# Patient Record
Sex: Female | Born: 1975
Health system: Southern US, Community
[De-identification: ages and names within clinical notes are randomized; demographics above are authoritative.]

## PROBLEM LIST (undated history)

## (undated) HISTORY — PX: TONSILLECTOMY: SUR1361

---

## 2016-02-08 ENCOUNTER — Emergency Department (HOSPITAL_BASED_OUTPATIENT_CLINIC_OR_DEPARTMENT_OTHER)
Admission: EM | Admit: 2016-02-08 | Discharge: 2016-02-08 | Disposition: A | Discharge status: Home / Self Care | Payer: 59 | Attending: Emergency Medicine | Admitting: Emergency Medicine

## 2016-02-08 ENCOUNTER — Emergency Department (HOSPITAL_BASED_OUTPATIENT_CLINIC_OR_DEPARTMENT_OTHER): Payer: 59

## 2016-02-08 ENCOUNTER — Encounter (HOSPITAL_BASED_OUTPATIENT_CLINIC_OR_DEPARTMENT_OTHER): Payer: Self-pay | Admitting: *Deleted

## 2016-02-08 DIAGNOSIS — Y929 Unspecified place or not applicable: Secondary | ICD-10-CM | POA: Diagnosis not present

## 2016-02-08 DIAGNOSIS — S99922A Unspecified injury of left foot, initial encounter: Secondary | ICD-10-CM

## 2016-02-08 DIAGNOSIS — Y999 Unspecified external cause status: Secondary | ICD-10-CM | POA: Diagnosis not present

## 2016-02-08 DIAGNOSIS — Y9389 Activity, other specified: Secondary | ICD-10-CM | POA: Diagnosis not present

## 2016-02-08 DIAGNOSIS — W230XXA Caught, crushed, jammed, or pinched between moving objects, initial encounter: Secondary | ICD-10-CM | POA: Diagnosis not present

## 2016-02-08 MED ORDER — NAPROXEN 500 MG PO TABS: 500.0000 mg | ORAL_TABLET | Freq: Two times a day (BID) | ORAL | Status: DC

## 2016-02-08 MED ORDER — NAPROXEN 250 MG PO TABS
500.0000 mg | ORAL_TABLET | Freq: Once | ORAL | Status: AC
  Administered 2016-02-08: 500 mg via ORAL
  Filled 2016-02-08: qty 2

## 2016-02-08 MED ORDER — OXYCODONE-ACETAMINOPHEN 5-325 MG PO TABS
1.0000 | ORAL_TABLET | Freq: Once | ORAL | Status: AC
  Administered 2016-02-08: 1 via ORAL
  Filled 2016-02-08: qty 1

## 2016-02-08 MED ORDER — OXYCODONE-ACETAMINOPHEN 5-325 MG PO TABS: 1.0000 | ORAL_TABLET | Freq: Four times a day (QID) | ORAL | Status: DC | PRN

## 2016-02-08 MED FILL — OXYCODONE/APAP 5-325: 5-325 | 3 days supply | Qty: 10 | Fill #0

## 2016-02-08 MED FILL — NAPROXEN 500 MG TABLET: 500 | 15 days supply | Qty: 30 | Fill #0

## 2016-02-08 NOTE — ED Provider Notes (Signed)
CSN: [id number]     Arrival date & time 02/08/16  1457 History   First MD Initiated Contact with Patient 02/08/16 1511     Chief Complaint  Patient presents with  . Foot Injury     (Consider location/radiation/quality/duration/timing/severity/associated sxs/prior Treatment) HPI   Brooke Oliver is a 40 y.o. female, patient with no pertinent past medical history, presenting to the ED with a left foot injury that occurred about an hour prior to arrival. Patient states she was pulling the car into the garage with her left foot dangling out of the car door. Patient did not move her foot back into the car in time and caught her foot in between the frame of the car and the garage doorway. Patient was able to back the car out immediately. Complains of pain and swelling to the top of the left foot. Has not taken any medication for her pain. Denies neuro deficits, pain extending up the leg, or any other pain, injuries, or complaints.     History reviewed. No pertinent past medical history. Past Surgical History  Procedure Laterality Date  . Tonsillectomy     No family history on file. Social History  Substance Use Topics  . Smoking status: Never Smoker   . Smokeless tobacco: None  . Alcohol Use: Yes     Comment: occ   OB History    No data available     Review of Systems  Musculoskeletal: Positive for arthralgias (left foot pain). Negative for back pain and neck pain.       Left foot swelling and bruising  Skin: Positive for color change and wound.  Neurological: Negative for weakness and numbness.      Allergies  Review of patient's allergies indicates no known allergies.  Home Medications   Prior to Admission medications   Medication Sig Start Date End Date Taking? Authorizing Provider  naproxen (NAPROSYN) 500 MG tablet Take 1 tablet (500 mg total) by mouth 2 (two) times daily. 02/08/16   Kala Ambriz C Shatima Zalar, PA-C  oxyCODONE-acetaminophen (PERCOCET/ROXICET) 5-325 MG tablet Take 1  tablet by mouth every 6 (six) hours as needed for severe pain. 02/08/16   Lyndsey Demos C Quinne Pires, PA-C   BP 143/85 mmHg  Pulse 96  Temp(Src) 98.5 F (36.9 C)  Resp 20  Ht  (1.499 m)  Wt 86.183 kg  BMI 38.35 kg/m2  SpO2 97% Physical Exam  Constitutional: She appears well-developed and well-nourished. No distress.  HENT:  Head: Normocephalic and atraumatic.  Eyes: Conjunctivae are normal.  Neck: Neck supple.  Cardiovascular: Normal rate and regular rhythm.   Pulmonary/Chest: Effort normal.  Musculoskeletal: She exhibits edema and tenderness.  Tenderness, bruising, and swelling to the dorsum of the left foot. Full range of motion in the left ankle and toes. No notable deformity, crepitus, or deep open wounds.  Neurological: She is alert.  No sensory deficits in bilateral feet and toes. Strength is 5 out of 5 bilaterally.  Skin: Skin is warm and dry. She is not diaphoretic.  Superficial abrasions across the dorsum of the left foot.  Psychiatric: She has a normal mood and affect. Her behavior is normal.  Nursing note and vitals reviewed.   ED Course  Procedures (including critical care time)  Imaging Review Dg Foot Complete Left  02/08/2016  CLINICAL DATA:  Crush injury left foot today, pain and swelling EXAM: LEFT FOOT - COMPLETE 3+ VIEW COMPARISON:  None. FINDINGS: Three views of the left foot submitted. No acute fracture or  subluxation. No radiopaque foreign body. There is soft tissue swelling metatarsal region. Tiny plantar spur of calcaneus. IMPRESSION: No acute fracture or subluxation. Soft tissue swelling metatarsal region. Electronically Signed   By: Natasha MeadLiviu  Pop M.D.   On: 02/08/2016 15:43   I have personally reviewed and evaluated these images as part of my medical decision-making.   EKG Interpretation None      MDM   Final diagnoses:  Foot injury, left, initial encounter    Brooke Oliver presents with left foot injury that occurred just prior to arrival.  No osseous  abnormality on x-ray. Postop shoe and crutches provided to avoid weightbearing. Patient to follow-up with orthopedic should symptoms fail to resolve. The patient was given instructions for home care as well as return precautions. Patient voices understanding of these instructions, accepts the plan, and is comfortable with discharge.    Anselm PancoastShawn C Tabatha Razzano, PA-C 02/08/16 1630  Lyndal Pulleyaniel Knott, MD 02/10/16 [phone]

## 2016-02-08 NOTE — Discharge Instructions (Signed)
You have been seen today for a foot injury. Your imaging showed no abnormalities. There could still be soft tissue injuries or injuries to structures like ligaments. Use the post op shoe and crutches to keep weight off of the foot. Use naproxen or ibuprofen to reduce pain and inflammation. Elevate the extremity as often as possible. Apply ice to reduce inflammation. Follow-up with orthopedics should symptoms fail to resolve.

## 2016-02-08 NOTE — ED Notes (Addendum)
Pt's shoe sized called for a medium post op shoe however it was too big and bulky causing problems walking. Pt opted to go with a small post op shoe instead.

## 2016-02-08 NOTE — ED Notes (Signed)
She crushed her left foot between her car and the wall of the garage. Swelling and pain. Ice pack on arrival.

## 2017-03-13 IMAGING — CR DG FOOT COMPLETE 3+V*L*
3 series · 3 of 3 positions shown · non-contrast
Comparison: None.

CLINICAL DATA: Crush injury left foot today, pain and swelling

EXAM:
LEFT FOOT - COMPLETE 3+ VIEW

[t foot ap left]
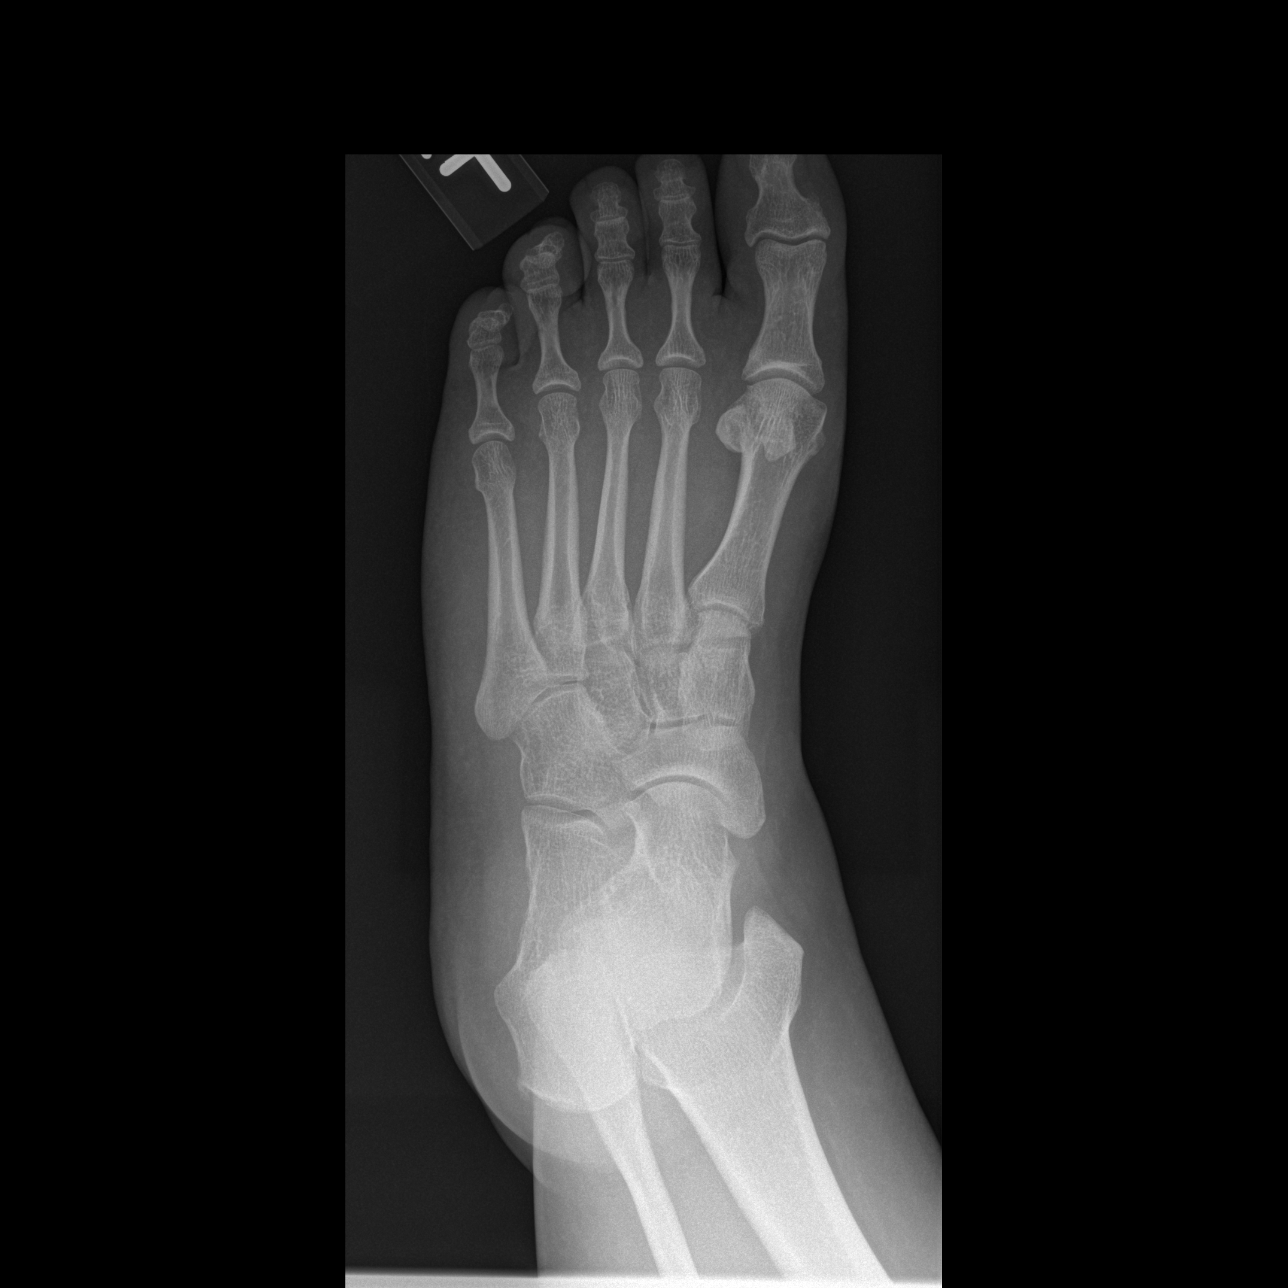

[t foot oblique left]
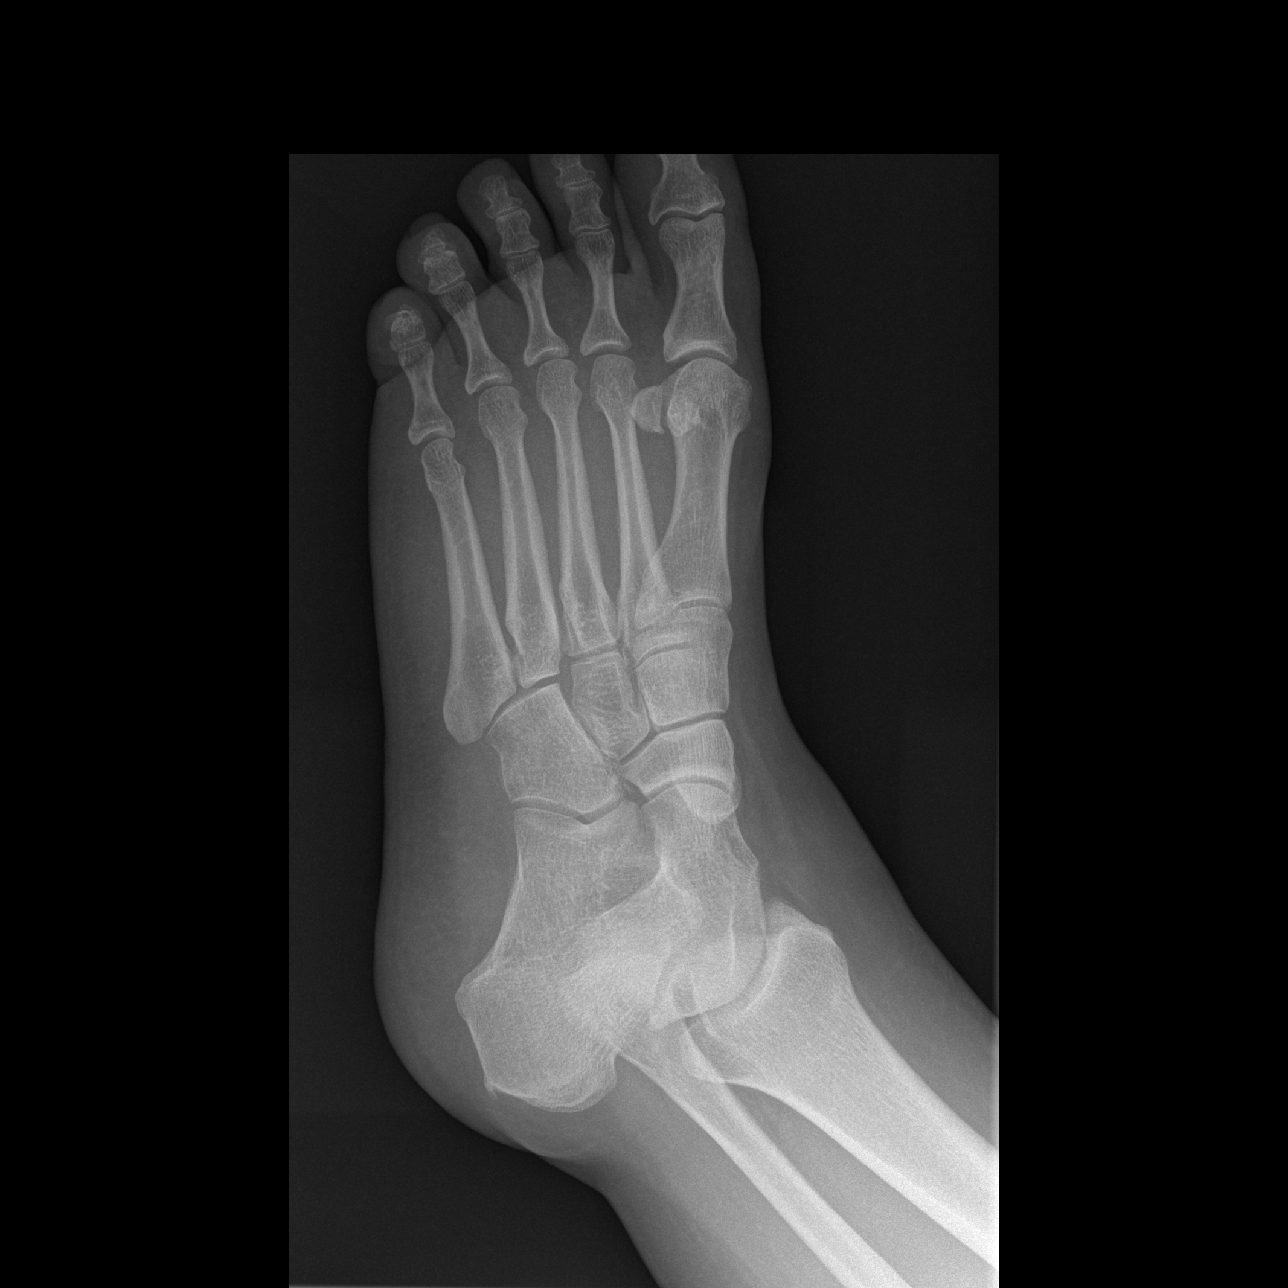

[t foot lat left]
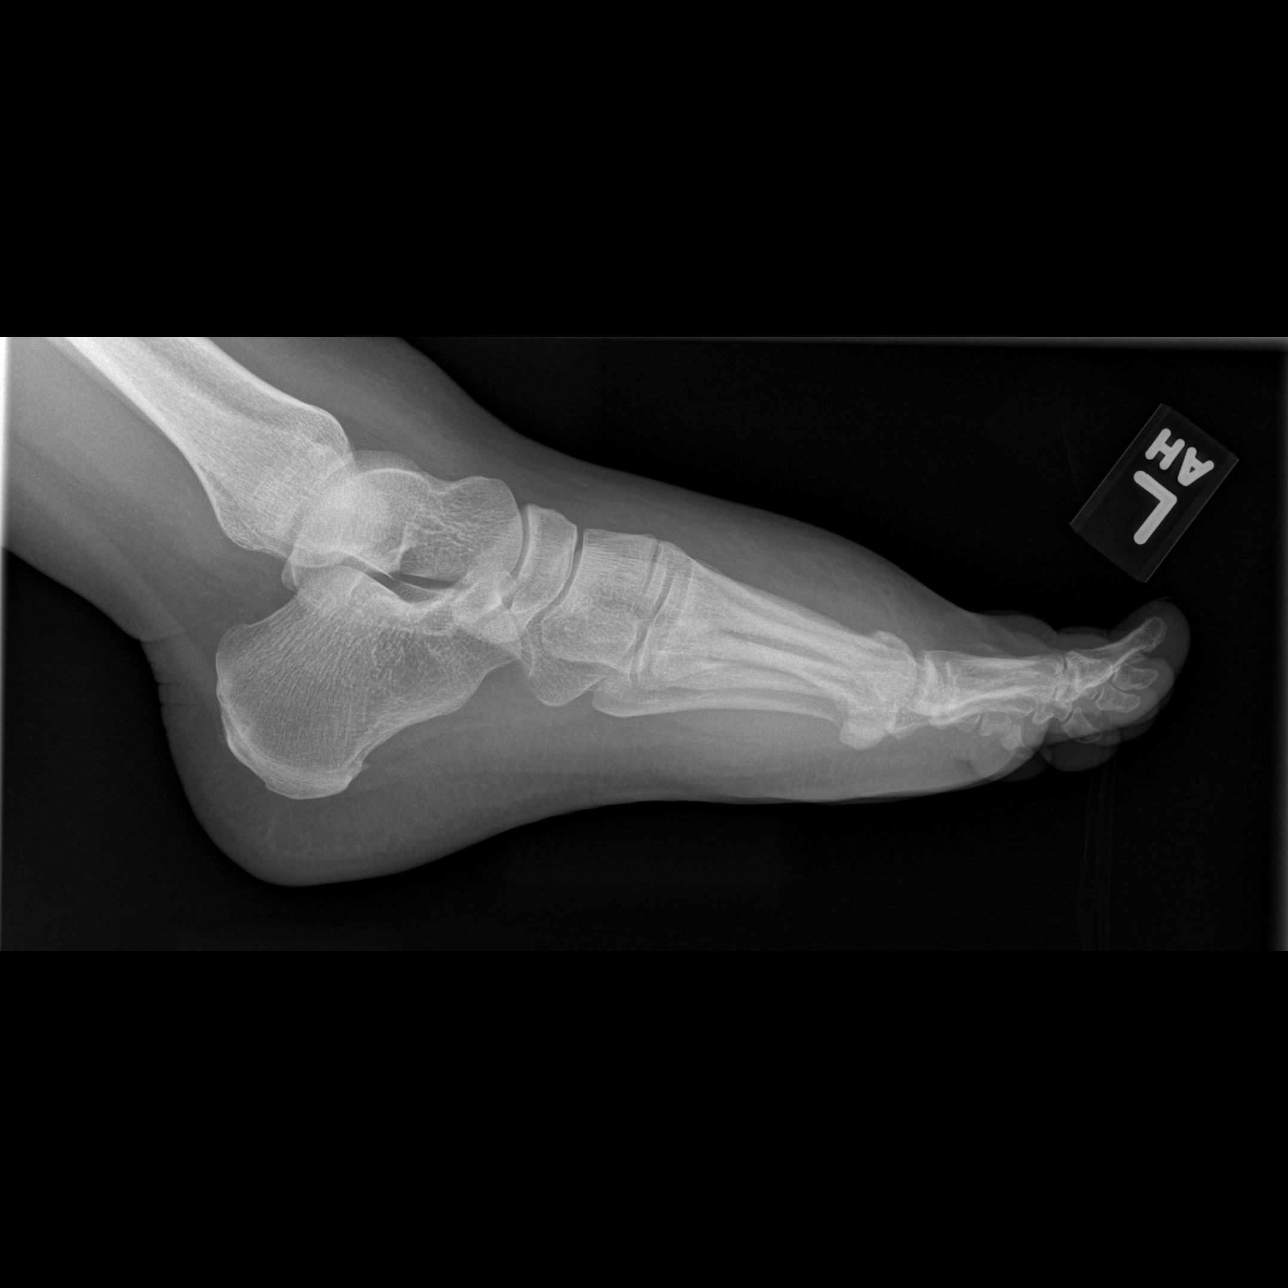

[3 of 3 positions shown; findings below may reference images not displayed]

FINDINGS: Three views of the left foot submitted. No acute fracture or
subluxation. No radiopaque foreign body. There is soft tissue
swelling metatarsal region. Tiny plantar spur of calcaneus.
IMPRESSION: No acute fracture or subluxation. Soft tissue swelling metatarsal
region.

## 2020-08-21 ENCOUNTER — Emergency Department (HOSPITAL_BASED_OUTPATIENT_CLINIC_OR_DEPARTMENT_OTHER)
Admission: EM | Admit: 2020-08-21 | Discharge: 2020-08-22 | Disposition: A | Discharge status: Home / Self Care | Payer: 59 | Attending: Emergency Medicine | Admitting: Emergency Medicine

## 2020-08-21 ENCOUNTER — Other Ambulatory Visit: Payer: Self-pay

## 2020-08-21 ENCOUNTER — Encounter (HOSPITAL_BASED_OUTPATIENT_CLINIC_OR_DEPARTMENT_OTHER): Payer: Self-pay | Admitting: Emergency Medicine

## 2020-08-21 DIAGNOSIS — S81811A Laceration without foreign body, right lower leg, initial encounter: Secondary | ICD-10-CM | POA: Diagnosis not present

## 2020-08-21 DIAGNOSIS — Z7289 Other problems related to lifestyle: Secondary | ICD-10-CM

## 2020-08-21 DIAGNOSIS — Z20822 Contact with and (suspected) exposure to covid-19: Secondary | ICD-10-CM | POA: Diagnosis not present

## 2020-08-21 DIAGNOSIS — X781XXA Intentional self-harm by knife, initial encounter: Secondary | ICD-10-CM | POA: Insufficient documentation

## 2020-08-21 DIAGNOSIS — F322 Major depressive disorder, single episode, severe without psychotic features: Secondary | ICD-10-CM | POA: Insufficient documentation

## 2020-08-21 DIAGNOSIS — S8991XA Unspecified injury of right lower leg, initial encounter: Secondary | ICD-10-CM | POA: Diagnosis present

## 2020-08-21 DIAGNOSIS — R000 Tachycardia, unspecified: Secondary | ICD-10-CM | POA: Diagnosis not present

## 2020-08-21 MED ORDER — LIDOCAINE HCL (PF) 1 % IJ SOLN
INTRAMUSCULAR | Status: AC
  Filled 2020-08-21: qty 30

## 2020-08-21 MED ORDER — LIDOCAINE HCL (PF) 1 % IJ SOLN
30.0000 mL | Freq: Once | INTRAMUSCULAR | Status: AC
  Administered 2020-08-21: 10 mL via INTRADERMAL

## 2020-08-21 MED ORDER — LIDOCAINE HCL (PF) 1 % IJ SOLN
30.0000 mL | Freq: Once | INTRAMUSCULAR | Status: AC
Start: 2020-08-21 — End: 2020-08-21
  Administered 2020-08-21: 30 mL
  Filled 2020-08-21: qty 30

## 2020-08-21 NOTE — ED Provider Notes (Addendum)
[facility] EMERGENCY DEPARTMENT Provider Note   CSN: [id number] Arrival date & time: 08/21/20  2245     History Chief Complaint  Patient presents with   Extremity Laceration    Brooke Oliver is a 45 y.o. female who presents to the ED for evaluation of self induced right lower extremity lacerations that occurred 45 minutes PTA. Patient states that she became upset due to a disagreement she had with her husband and started to cut herself, has a hx of doing so, and accidentally cut too deep with an exacto knife.  Her wounds are painful, no alleviating/aggravating factors. She states that she was not trying to commit suicide and is not suicidal. No other areas of injury. Denies numbness, tingling, or weakness. She has been cutting herself when she is upset for the past 4-5 months. She started seeing a therapist last week. Not currently on any medicines.   HPI     History reviewed. No pertinent past medical history.  There are no problems to display for this patient.   Past Surgical History:  Procedure Laterality Date   TONSILLECTOMY       OB History   No obstetric history on file.     No family history on file.  Social History   Tobacco Use   Smoking status: Never Smoker  Substance Use Topics   Alcohol use: Yes    Comment: occ   Drug use: No    Home Medications Prior to Admission medications   Medication Sig Start Date End Date Taking? Authorizing Provider  MELATONIN PO Take by mouth.   Yes [provider]    Allergies    Patient has no known allergies.  Review of Systems   Review of Systems  Constitutional: Negative for chills and fever.  Respiratory: Negative for cough and shortness of breath.   Cardiovascular: Negative for chest pain.  Gastrointestinal: Negative for abdominal pain.  Skin: Positive for wound (painful).  Neurological: Negative for syncope, weakness and numbness.  Psychiatric/Behavioral: Positive for self-injury.  Negative for hallucinations and suicidal ideas.       Negative for homicidal ideation.   All other systems reviewed and are negative.   Physical Exam Updated Vital Signs BP (!) 150/112 (BP Location: Right Arm)    Pulse (!) 111    Temp 98.4 F (36.9 C) (Oral)    Resp 18    Ht 4\' 11"  (1.499 m)    Wt 90.7 kg    SpO2 100%    BMI 40.40 kg/m   Physical Exam Vitals and nursing note reviewed.  Constitutional:      General: She is not in acute distress.    Appearance: She is well-developed. She is not ill-appearing or toxic-appearing.  HENT:     Head: Normocephalic and atraumatic.  Eyes:     General:        Right eye: No discharge.        Left eye: No discharge.     Conjunctiva/sclera: Conjunctivae normal.  Cardiovascular:     Rate and Rhythm: Regular rhythm. Tachycardia present.     Pulses:          Dorsalis pedis pulses are 2+ on the right side and 2+ on the left side.       Posterior tibial pulses are 2+ on the right side and 2+ on the left side.  Pulmonary:     Effort: Pulmonary effort is normal. No respiratory distress.     Breath sounds: Normal breath  sounds. No wheezing, rhonchi or rales.  Abdominal:     General: There is no distension.     Palpations: Abdomen is soft.     Tenderness: There is no abdominal tenderness.  Musculoskeletal:     Cervical back: Neck supple.     Comments: Lower extremities: Patient has multiple lacerations to the left upper anterolateral leg. Variable depths.There is SQ tissue exposed. No fascia/musclar involvement. Visualized in a bloodless field without evidence of FB. Laceration measurements below. Laceration 1 is 18 cm. Laceration 2 is 29 cm and laceration 3 is 40 cm. She has intact AROM throughout the LEs bilaterally. Able to flex/extend the hip and knee of RLE against resistance. No focal bony tenderness.   Skin:    General: Skin is warm and dry.     Capillary Refill: Capillary refill takes less than 2 seconds.     Findings: No rash.   Neurological:     Mental Status: She is alert.     Comments: Alert. Clear speech. Sensation grossly intact to bilateral lower extremities. 5/5 strength with hip flexion/extension, knee flexion/extension & ankle plantar/dorsiflexion bilaterally. Patient ambulatory   Psychiatric:        Mood and Affect: Affect is tearful.       ED Results / Procedures / Treatments   Labs (all labs ordered are listed, but only abnormal results are displayed) Labs Reviewed - No data to display  EKG None  Radiology No results found.  Procedures .Marland KitchenLaceration Repair  Date/Time: 08/22/2020 12:23 AM Performed by: Cherly Anderson, PA-C Authorized by: Cherly Anderson, PA-C   Consent:    Consent obtained:  Verbal   Consent given by:  Patient   Risks, benefits, and alternatives were discussed: yes     Risks discussed:  Infection, need for additional repair, nerve damage, poor wound healing, poor cosmetic result, pain, retained foreign body, tendon damage and vascular damage   Alternatives discussed:  No treatment Anesthesia:    Anesthesia method:  Local infiltration   Local anesthetic:  Lidocaine 1% w/o epi Laceration details:    Location:  Leg   Leg location:  R upper leg   Length (cm):  18   Depth (mm):  5 Exploration:    Hemostasis achieved with:  Direct pressure Treatment:    Area cleansed with:  Povidone-iodine   Amount of cleaning:  Standard   Irrigation solution:  Sterile water   Irrigation method:  Pressure wash Skin repair:    Repair method:  Staples   Number of staples:  15 Approximation:    Approximation:  Close Post-procedure details:    Procedure completion:  Tolerated well, no immediate complications .Marland KitchenLaceration Repair  Date/Time: 08/22/2020 12:31 AM Performed by: Cherly Anderson, PA-C Authorized by: Cherly Anderson, PA-C   Consent:    Consent obtained:  Verbal   Consent given by:  Patient   Risks, benefits, and alternatives were  discussed: yes     Risks discussed:  Need for additional repair, infection, nerve damage, poor wound healing, poor cosmetic result, pain, retained foreign body, tendon damage and vascular damage   Alternatives discussed:  No treatment Anesthesia:    Anesthesia method:  Local infiltration   Local anesthetic:  Lidocaine 1% w/o epi Laceration details:    Location:  Leg   Leg location:  R upper leg   Length (cm):  31   Depth (mm):  5 Pre-procedure details:    Preparation:  Patient was prepped and draped in usual sterile fashion  Exploration:    Hemostasis achieved with:  Direct pressure Treatment:    Area cleansed with:  Povidone-iodine   Amount of cleaning:  Standard   Irrigation solution:  Sterile water   Irrigation method:  Pressure wash Skin repair:    Repair method:  Staples   Number of staples:  29 Approximation:    Approximation:  Close Post-procedure details:    Procedure completion:  Tolerated well, no immediate complications .Marland Kitchen.Laceration Repair  Date/Time: 08/22/2020 12:32 AM Performed by: Cherly AndersonPetrucelli, Yajaira Doffing R, PA-C Authorized by: Cherly AndersonPetrucelli, Nahom Carfagno R, PA-C   Consent:    Consent obtained:  Verbal   Consent given by:  Patient   Risks, benefits, and alternatives were discussed: yes     Risks discussed:  Infection, need for additional repair, nerve damage, poor wound healing, poor cosmetic result, pain, retained foreign body, tendon damage and vascular damage   Alternatives discussed:  No treatment Anesthesia:    Anesthesia method:  Local infiltration   Local anesthetic:  Lidocaine 1% w/o epi Laceration details:    Location:  Leg   Leg location:  R upper leg   Length (cm):  40   Depth (mm):  5 Pre-procedure details:    Preparation:  Patient was prepped and draped in usual sterile fashion Exploration:    Hemostasis achieved with:  Direct pressure   Wound exploration: wound explored through full range of motion   Treatment:    Area cleansed with:   Povidone-iodine   Amount of cleaning:  Standard   Irrigation solution:  Sterile water   Irrigation method:  Pressure wash Skin repair:    Repair method:  Staples   Number of staples:  31 Approximation:    Approximation:  Close Post-procedure details:    Procedure completion:  Tolerated well, no immediate complications     Laceration 1: 15 staples, 18 cm   Laceration 2: 31 staples, 29 cm   Laceration 3: 31 staples, 40 cm    Medications Ordered in ED Medications  lidocaine (PF) (XYLOCAINE) 1 % injection 30 mL (has no administration in time range)    ED Course  I have reviewed the triage vital signs and the nursing notes.  Pertinent labs & imaging results that were available during my care of the patient were reviewed by me and considered in my medical decision making (see chart for details).    MDM Rules/Calculators/A&P                         Patient presents to the ED S/p self inflicted RLE lacerations.  She is nontoxic, vitals w/ mild tachycardia. Tearful on exam.  Additional history obtained from chart review and nursing note reviewed. Wounds pressure irrigated & visualized in a bloodless field without evidence of FB or muscular involvement.  Lacerations were repaired per procedure note above with a total of 75 staples. Total of 35 mL of 1% lidocaine wo epi was used in repair, calculated maximum dose of 40 mL.  Tetanus is up-to-date, last was 3 years ago.  Patient neurovascularly intact distally prior to and following procedure.  Based on mechanism and lack of bony tenderness I have a low suspicion for underlying fracture or dislocation.  Given self injures behavior with significance of wound will obtain screening labs and consult TTS for psychiatric evaluation.  I ordered labs including CBC, CMP, ethanol level, salicylate level, acetaminophen level UDS, pregnancy test, and COVID-19 testing for screening.  I personally viewed and interpreted blood  work that is back thus  far which are fairly unremarkable- mild leukocytosis felt to be nonspecific.  Patient medically cleared. Disposition per [facility].  Staples will need removal in 10 days.     Final Clinical Impression(s) / ED Diagnoses Final diagnoses:  Laceration of multiple sites of right lower extremity, initial encounter  Self-injurious behavior    Rx / DC Orders ED Discharge Orders    None       Cherly Anderson, PA-C 08/22/20 0042    Cherly Anderson, PA-C 08/22/20 0044    Cherly Anderson, PA-C 08/22/20 0962    Geoffery Lyons, MD 08/22/20 [phone]

## 2020-08-21 NOTE — ED Triage Notes (Addendum)
Pt has several long self inflicted lacerations. Minimal bleeding noted. Adipose tissue visible.  Pt has hx of self inflicted cutting. Denies SI.

## 2020-08-21 NOTE — ED Triage Notes (Signed)
Guaze bandages applied and secured with ace wrap.

## 2020-08-22 LAB — CBC
HCT: 39.5 % (ref 36.0–46.0)
Hemoglobin: 13.8 g/dL (ref 12.0–15.0)
MCH: 31.7 pg (ref 26.0–34.0)
MCHC: 34.9 g/dL (ref 30.0–36.0)
MCV: 90.8 fL (ref 80.0–100.0)
Platelets: 262 10*3/uL (ref 150–400)
RBC: 4.35 MIL/uL (ref 3.87–5.11)
RDW: 12.4 % (ref 11.5–15.5)
WBC: 10.7 10*3/uL — ABNORMAL HIGH (ref 4.0–10.5)
nRBC: 0 % (ref 0.0–0.2)

## 2020-08-22 LAB — COMPREHENSIVE METABOLIC PANEL
ALT: 20 U/L (ref 0–44)
AST: 20 U/L (ref 15–41)
Albumin: 4 g/dL (ref 3.5–5.0)
Alkaline Phosphatase: 38 U/L (ref 38–126)
Anion gap: 10 (ref 5–15)
BUN: 11 mg/dL (ref 6–20)
CO2: 22 mmol/L (ref 22–32)
Calcium: 9 mg/dL (ref 8.9–10.3)
Chloride: 105 mmol/L (ref 98–111)
Creatinine, Ser: 0.7 mg/dL (ref 0.44–1.00)
GFR, Estimated: >60 mL/min (ref >60)
Glucose, Bld: 135 mg/dL — ABNORMAL HIGH (ref 70–99)
Potassium: 3.6 mmol/L (ref 3.5–5.1)
Sodium: 137 mmol/L (ref 135–145)
Total Bilirubin: 0.6 mg/dL (ref 0.3–1.2)
Total Protein: 6.9 g/dL (ref 6.5–8.1)

## 2020-08-22 LAB — RAPID URINE DRUG SCREEN, HOSP PERFORMED
Amphetamines: NOT DETECTED
Barbiturates: NOT DETECTED
Benzodiazepines: NOT DETECTED
Cocaine: NOT DETECTED
Opiates: NOT DETECTED
Tetrahydrocannabinol: NOT DETECTED

## 2020-08-22 LAB — SALICYLATE LEVEL: Salicylate Lvl: <7 mg/dL — ABNORMAL LOW (ref 7.0–30.0)

## 2020-08-22 LAB — ACETAMINOPHEN LEVEL: Acetaminophen (Tylenol), Serum: <10 ug/mL — ABNORMAL LOW (ref 10–30)

## 2020-08-22 LAB — ETHANOL: Alcohol, Ethyl (B): <10 mg/dL (ref <10)

## 2020-08-22 LAB — PREGNANCY, URINE: Preg Test, Ur: NEGATIVE

## 2020-08-22 LAB — SARS CORONAVIRUS 2 BY RT PCR (HOSPITAL ORDER, PERFORMED IN ~~LOC~~ HOSPITAL LAB): SARS Coronavirus 2: NEGATIVE

## 2020-08-22 MED ORDER — ONDANSETRON HCL 4 MG PO TABS
4.0000 mg | ORAL_TABLET | Freq: Three times a day (TID) | ORAL | Status: DC | PRN
  Filled 2020-08-22: qty 1

## 2020-08-22 MED ORDER — TETANUS-DIPHTH-ACELL PERTUSSIS 5-2.5-18.5 LF-MCG/0.5 IM SUSY
0.5000 mL | PREFILLED_SYRINGE | Freq: Once | INTRAMUSCULAR | Status: DC
  Filled 2020-08-22: qty 0.5

## 2020-08-22 MED ORDER — ACETAMINOPHEN 325 MG PO TABS
650.0000 mg | ORAL_TABLET | ORAL | Status: DC | PRN
  Administered 2020-08-22 (×2): 650 mg via ORAL
  Filled 2020-08-22 (×2): qty 2

## 2020-08-22 MED ORDER — IBUPROFEN 400 MG PO TABS
600.0000 mg | ORAL_TABLET | Freq: Four times a day (QID) | ORAL | Status: DC | PRN
  Administered 2020-08-22: 600 mg via ORAL
  Filled 2020-08-22: qty 1

## 2020-08-22 MED ORDER — ZOLPIDEM TARTRATE 5 MG PO TABS
5.0000 mg | ORAL_TABLET | Freq: Every evening | ORAL | Status: DC | PRN
  Filled 2020-08-22: qty 1

## 2020-08-22 MED ORDER — ALUM & MAG HYDROXIDE-SIMETH 200-200-20 MG/5ML PO SUSP: 30.0000 mL | Freq: Four times a day (QID) | ORAL | Status: DC | PRN

## 2020-08-22 MED ORDER — BACITRACIN ZINC 500 UNIT/GM EX OINT: TOPICAL_OINTMENT | Freq: Once | CUTANEOUS | Status: AC

## 2020-08-22 MED ORDER — BACITRACIN ZINC 500 UNIT/GM EX OINT
TOPICAL_OINTMENT | CUTANEOUS | Status: AC
  Administered 2020-08-22: 1 via TOPICAL
  Filled 2020-08-22: qty 28.35

## 2020-08-22 NOTE — Progress Notes (Signed)
Patient seen by TTS counselor Darcey Nora and staffed with me (see TTS note). Patient denies any SI and denies suicidal intent behind cutting and is psych cleared for discharge. Mr. Brooke Oliver spoke with patient's husband, who agrees to lock up the box cutter and sharps at home. I saw the patient over telepsych as well and spoke with the patient about safety planning due to the severity of her wounds. Patient reports the cutting was impulsive. She has been cutting for 6 months and had never cut so deep. She did not realize how deep the cutting was until afterwards. I reviewed with her alternative coping mechanisms, such as snapping rubber bands or rubbing ice on herself to avoid cutting. Reviewed that she could cause serious injury or accidental death with deep cutting when using sharp blade like she used yesterday, and she states understanding. Also reviewed that she could call or text suicide hotline or call 911 if in crisis to prevent self-harm, and patient states agreement. She is following up with a new outpatient counselor on Thursday and states that she will continue working on coping skills with her. Declines referrals for medication management at this time.

## 2020-08-22 NOTE — BH Assessment (Signed)
Brooke Hay, RN, notified through secure chat that per Marciano Sequin, NP, patient is psych cleared and recommended for discharge.
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
## 2020-08-22 NOTE — ED Notes (Signed)
Patient states tetanus injection within the last 3 years.

## 2020-08-22 NOTE — Discharge Instructions (Addendum)
You were seen in the emergency department today for lacerations to your right leg.  These lacerations were closed with 75 staples.  Please keep this area clean and dry for the next 24 hours, after 24 hours you may get this area wet, but avoid soaking the area. Keep the area covered as best possible especially when in the sun to help in minimizing scarring.   You will need to have the staples removed and the wound rechecked in 10 days. Please return to the emergency department, go to an urgent care, or see your primary care provider to have this performed. Return to the ER soon should you start to experience pus type drainage from the wound, redness around the wound, or fevers as this could indicate the area is infected, please return to the ER for any other worsening symptoms or concerns that you may have.   Please follow up with behavioral health resources closely.

## 2020-08-22 NOTE — ED Notes (Signed)
Pt talking to TTS 

## 2020-08-22 NOTE — ED Notes (Signed)
TTS in progress 

## 2020-08-22 NOTE — ED Notes (Signed)
Patient complains of pain to right upper extremity with movement; tylenol given per order.

## 2020-08-22 NOTE — ED Provider Notes (Signed)
Psychiatry is consulted on patient.  The patient is not suicidal and they are comfortable with her going home.  She has a therapist appointment in 3 days.  She denies this was a suicide attempt.  She will need to return here or see urgent care in about 10 days for staple removal.   Pricilla Loveless, MD 08/22/20 1136
